# Patient Record
Sex: Female | Born: 2000 | Race: Black or African American | Hispanic: No | Marital: Single | Smoking: Never smoker
Health system: Southern US, Community
[De-identification: ages and names within clinical notes are randomized; demographics above are authoritative.]

## PROBLEM LIST (undated history)

## (undated) DIAGNOSIS — J45909 Unspecified asthma, uncomplicated: Secondary | ICD-10-CM

## (undated) DIAGNOSIS — E039 Hypothyroidism, unspecified: Secondary | ICD-10-CM

## (undated) HISTORY — PX: WISDOM TOOTH EXTRACTION: SHX21

---
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
## 2022-07-11 IMAGING — US US FETAL BPP W/ NON-STRESS
1 series · 14 of 14 positions shown · non-contrast
Comparison: none

[Series 1: us fetal bpp w/ non-stress · 14 acquisitions, 14 frames shown]
[im 1/14]
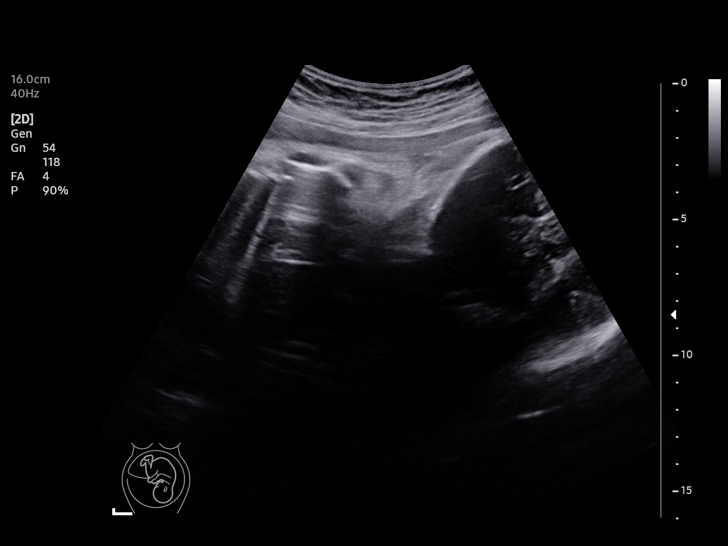
[im 2/14]
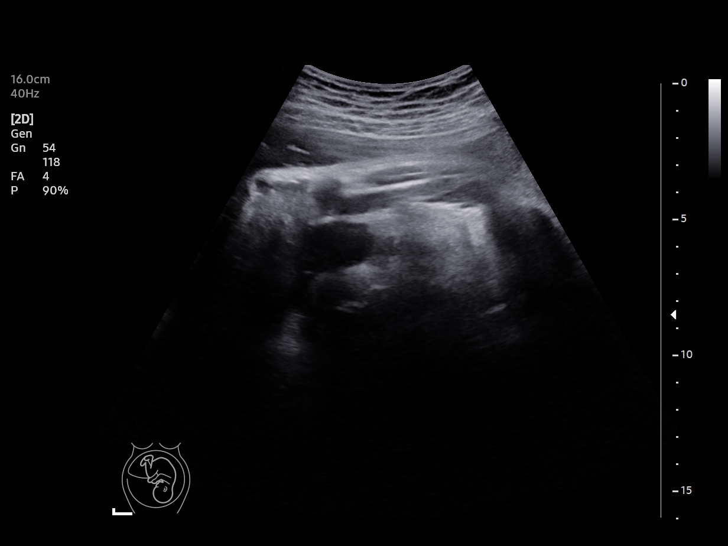
[im 3/14]
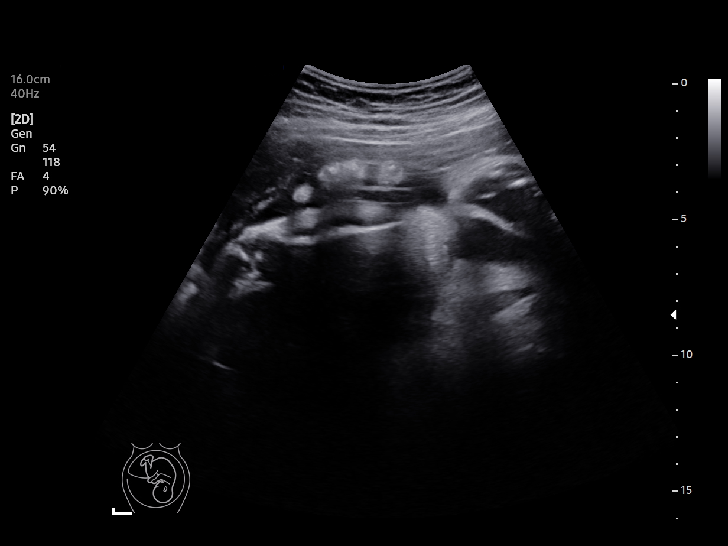
[im 4/14]
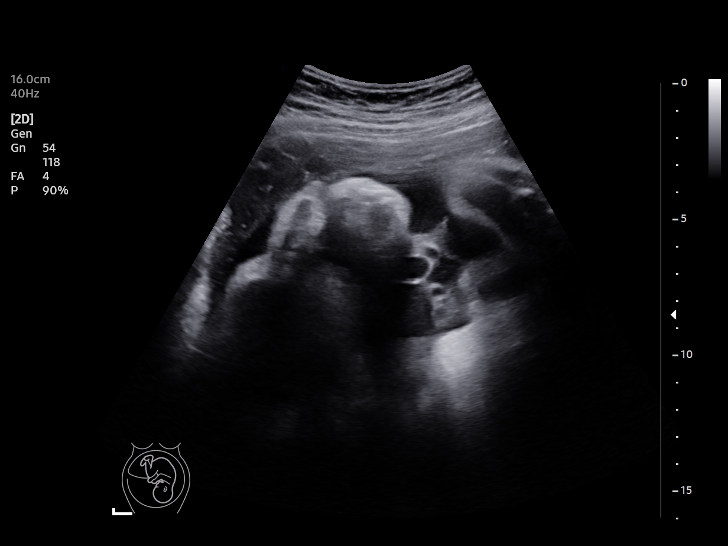
[im 5/14]
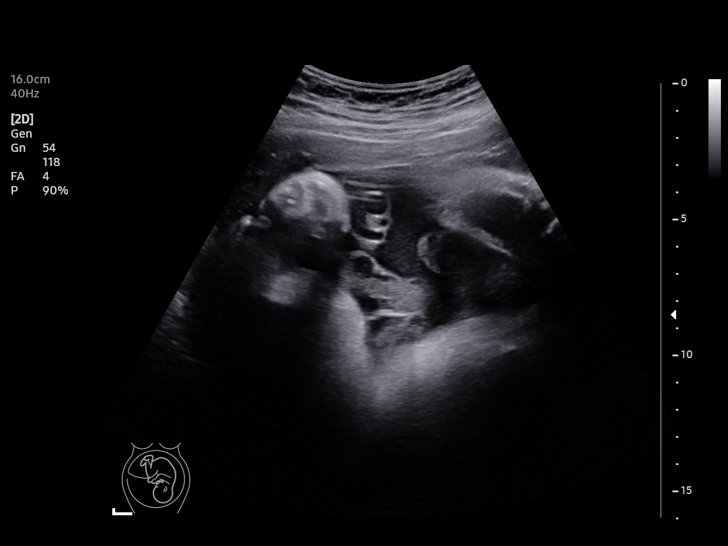
[im 6/14]
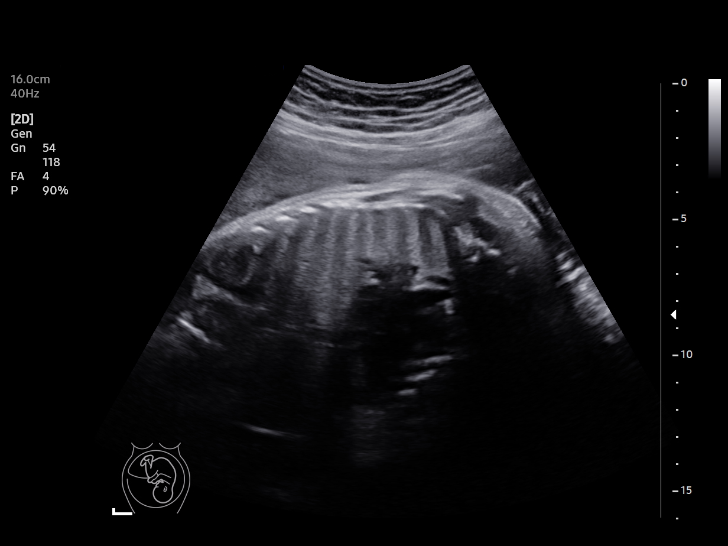
[im 7/14]
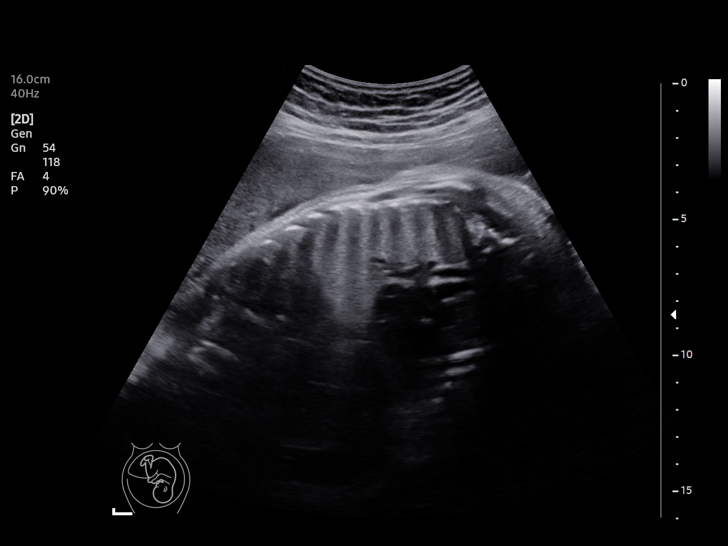
[im 8/14]
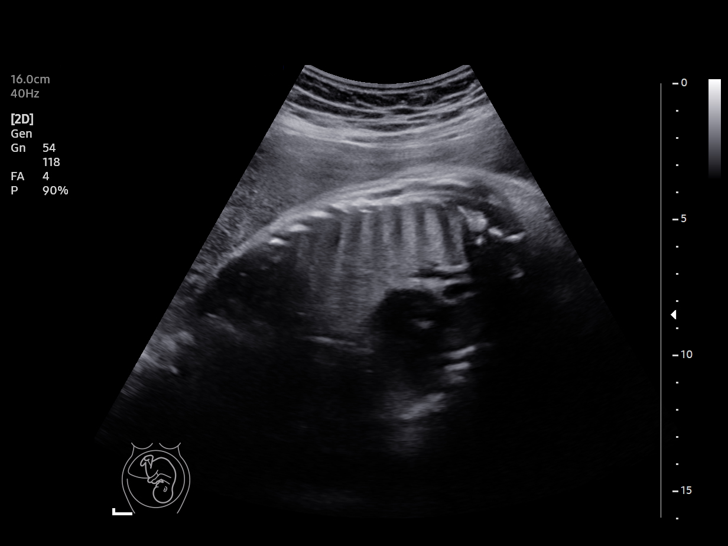
[im 9/14]
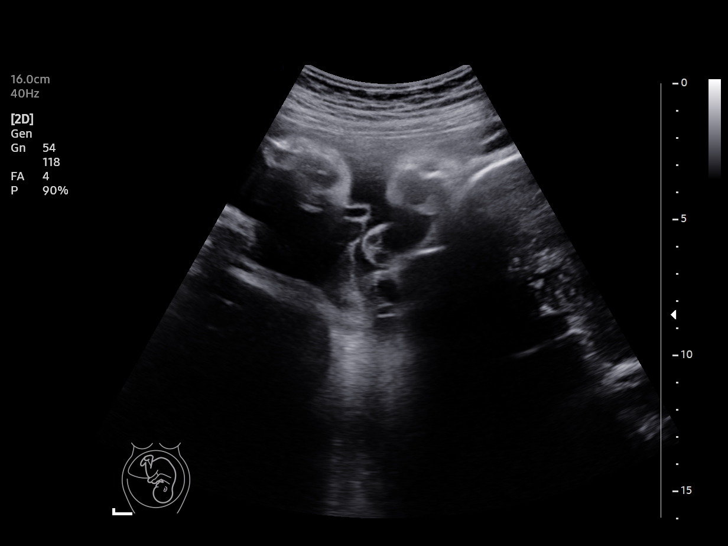
[im 10/14]
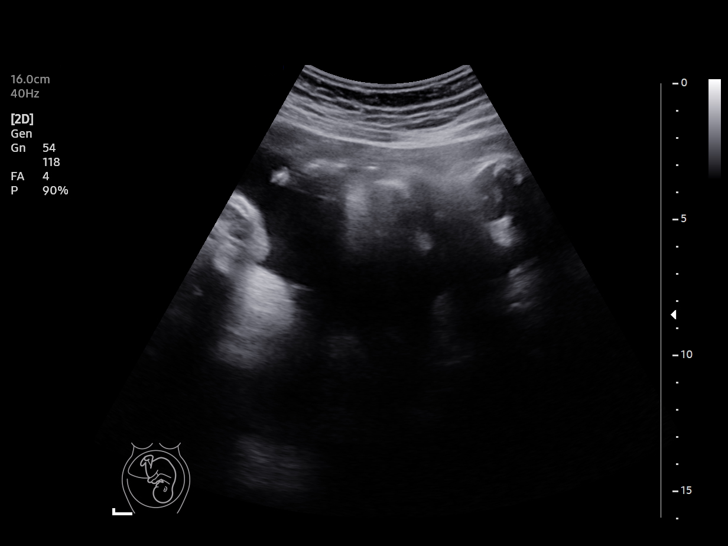
[im 11/14]
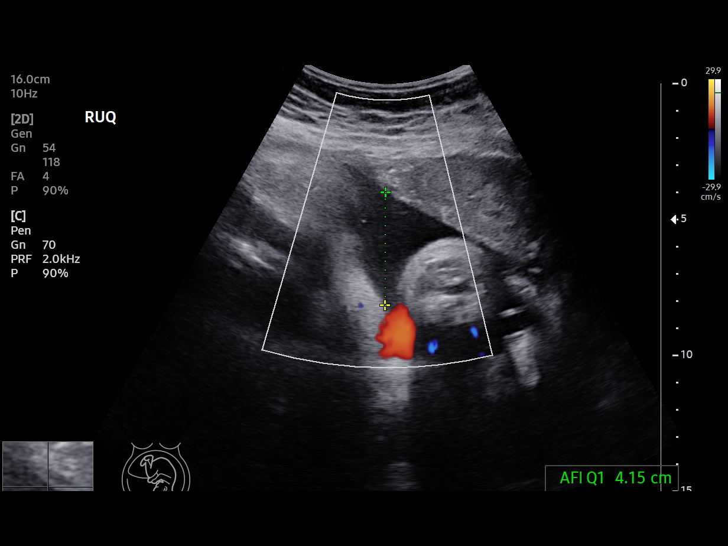
[im 12/14]
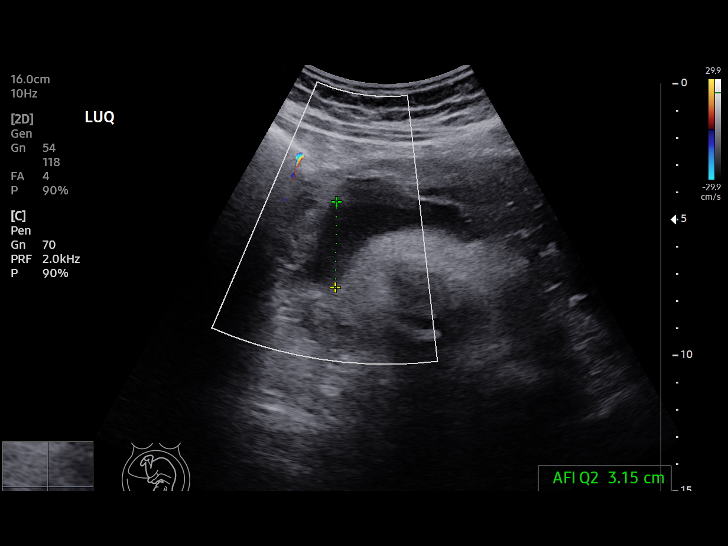
[im 13/14]
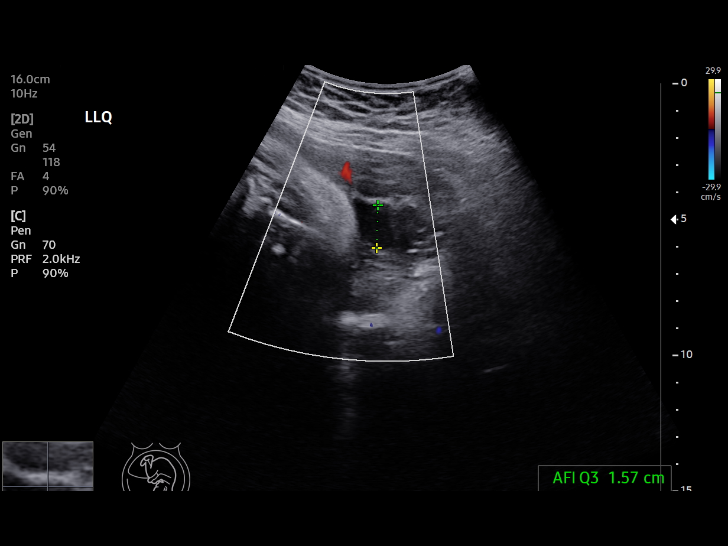
[im 14/14]
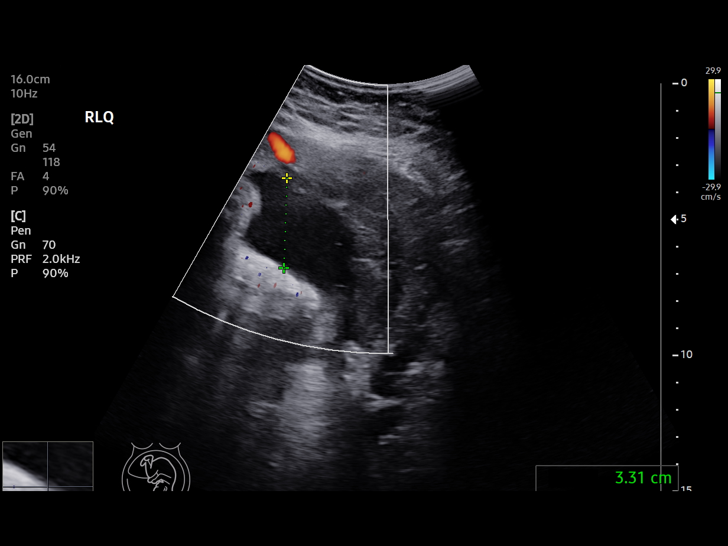

[14 of 14 positions shown; findings below may reference images not displayed]

[REDACTED]care at

Service(s) Provided

Indications

 36 weeks gestation of pregnancy
 Fetal abnormality - other known or
 suspected (specify) Suspected bowel
 obstruction
Fetal Evaluation

 Num Of Fetuses:         1
 Preg. Location:         Intrauterine
 Cardiac Activity:       Observed
 Presentation:           Cephalic

 Amniotic Fluid
 AFI FV:      Within normal limits

 AFI Sum(cm)     %Tile       Largest Pocket(cm)
 12.18           38

 RUQ(cm)       RLQ(cm)       LUQ(cm)        LLQ(cm)


 Comment:    BPP [DATE]
Biophysical Evaluation

 Amniotic F.V:   Pocket => 2 cm             F. Tone:        Observed
 F. Movement:    Observed                   N.S.T:          Reactive
 F. Breathing:   Observed                   Score:          [DATE]
OB History

 Gravidity:    1
 Living:       0
Gestational Age

 Best:          36w 0d     Det. By:  Early Ultrasound         EDD:   10/22/20
                                     (03/22/20)
